# Patient Record
Sex: Female | Born: 2000 | Race: Black or African American | Hispanic: No | Marital: Single
Health system: Southern US, Community
[De-identification: ages and names within clinical notes are randomized; demographics above are authoritative.]

## PROBLEM LIST (undated history)

## (undated) DIAGNOSIS — J45909 Unspecified asthma, uncomplicated: Secondary | ICD-10-CM

---
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
## 2020-02-07 ENCOUNTER — Ambulatory Visit: Payer: Self-pay | Attending: Family

## 2020-02-07 DIAGNOSIS — Z23 Encounter for immunization: Secondary | ICD-10-CM

## 2020-02-07 NOTE — Progress Notes (Signed)
   Covid-19 Vaccination Clinic  Name:  Alexis Gilmore    MRN: [id number] DOB: 2001-06-22  02/07/2020  Ms. Wonder was observed post Covid-19 immunization for 15 minutes without incident. She was provided with Vaccine Information Sheet and instruction to access the V-Safe system.   [name] was instructed to call 911 with any severe reactions post vaccine: Marland Kitchen Difficulty breathing  . Swelling of face and throat  . A fast heartbeat  . A bad rash all over body  . Dizziness and weakness   Immunizations Administered    Name Date Dose VIS Date Route   Moderna COVID-19 Vaccine 02/07/2020  5:16 PM 0.5 mL 10/30/2019 Intramuscular   Manufacturer: Moderna   Lot: [id number]   NDC: 53664-403-47

## 2020-03-11 ENCOUNTER — Other Ambulatory Visit: Payer: Self-pay

## 2020-03-11 ENCOUNTER — Ambulatory Visit: Payer: Self-pay | Attending: Family

## 2020-03-11 DIAGNOSIS — Z23 Encounter for immunization: Secondary | ICD-10-CM

## 2020-03-11 NOTE — Progress Notes (Signed)
   Covid-19 Vaccination Clinic  Name:  Shakhia Gramajo    MRN: [id number] DOB: 2001/03/20  03/11/2020  Ms. Latulippe was observed post Covid-19 immunization for 15 minutes without incident. She was provided with Vaccine Information Sheet and instruction to access the V-Safe system.   Ms. Joswick was instructed to call 911 with any severe reactions post vaccine: Marland Kitchen Difficulty breathing  . Swelling of face and throat  . A fast heartbeat  . A bad rash all over body  . Dizziness and weakness   Immunizations Administered    Name Date Dose VIS Date Route   Moderna COVID-19 Vaccine 03/11/2020  2:25 PM 0.5 mL 10/30/2019 Intramuscular   Manufacturer: Moderna   Lot: [id number]   NDC: 19379-024-09

## 2020-11-11 ENCOUNTER — Ambulatory Visit: Payer: Self-pay

## 2020-11-18 ENCOUNTER — Other Ambulatory Visit (HOSPITAL_BASED_OUTPATIENT_CLINIC_OR_DEPARTMENT_OTHER): Payer: Self-pay | Admitting: Internal Medicine

## 2020-11-18 ENCOUNTER — Ambulatory Visit: Payer: Self-pay

## 2020-11-19 MED FILL — MODERNA COVID-19 VACCINE 10: 100 | 1 days supply | Qty: 0 | Fill #0

## 2020-12-22 ENCOUNTER — Other Ambulatory Visit: Payer: Self-pay

## 2021-04-07 ENCOUNTER — Ambulatory Visit
Admission: RE | Admit: 2021-04-07 | Discharge: 2021-04-07 | Disposition: A | Discharge status: Home / Self Care | Payer: Federal, State, Local not specified - PPO | Source: Ambulatory Visit | Attending: Family Medicine | Admitting: Family Medicine

## 2021-04-07 ENCOUNTER — Other Ambulatory Visit: Payer: Self-pay | Admitting: Family Medicine

## 2021-04-07 DIAGNOSIS — M79652 Pain in left thigh: Secondary | ICD-10-CM

## 2021-04-07 DIAGNOSIS — M79605 Pain in left leg: Secondary | ICD-10-CM

## 2021-08-05 ENCOUNTER — Other Ambulatory Visit: Payer: Self-pay

## 2021-08-05 ENCOUNTER — Encounter (HOSPITAL_COMMUNITY): Payer: Self-pay

## 2021-08-05 ENCOUNTER — Emergency Department (HOSPITAL_COMMUNITY)
Admission: EM | Admit: 2021-08-05 | Discharge: 2021-08-06 | Disposition: A | Discharge status: Home / Self Care | Payer: Federal, State, Local not specified - PPO | Attending: Emergency Medicine | Admitting: Emergency Medicine

## 2021-08-05 ENCOUNTER — Emergency Department (HOSPITAL_COMMUNITY): Payer: Federal, State, Local not specified - PPO

## 2021-08-05 DIAGNOSIS — Z5321 Procedure and treatment not carried out due to patient leaving prior to being seen by health care provider: Secondary | ICD-10-CM | POA: Diagnosis not present

## 2021-08-05 DIAGNOSIS — R791 Abnormal coagulation profile: Secondary | ICD-10-CM | POA: Diagnosis not present

## 2021-08-05 DIAGNOSIS — R202 Paresthesia of skin: Secondary | ICD-10-CM | POA: Insufficient documentation

## 2021-08-05 LAB — PROTIME-INR
INR: 1 (ref 0.8–1.2)
Prothrombin Time: 13.2 seconds (ref 11.4–15.2)

## 2021-08-05 LAB — COMPREHENSIVE METABOLIC PANEL
ALT: 11 U/L (ref 0–44)
AST: 18 U/L (ref 15–41)
Albumin: 4.3 g/dL (ref 3.5–5.0)
Alkaline Phosphatase: 50 U/L (ref 38–126)
Anion gap: 7 (ref 5–15)
BUN: 13 mg/dL (ref 6–20)
CO2: 26 mmol/L (ref 22–32)
Calcium: 9.1 mg/dL (ref 8.9–10.3)
Chloride: 104 mmol/L (ref 98–111)
Creatinine, Ser: 0.86 mg/dL (ref 0.44–1.00)
GFR, Estimated: >60 mL/min (ref >60)
Glucose, Bld: 85 mg/dL (ref 70–99)
Potassium: 3.8 mmol/L (ref 3.5–5.1)
Sodium: 137 mmol/L (ref 135–145)
Total Bilirubin: 0.7 mg/dL (ref 0.3–1.2)
Total Protein: 7 g/dL (ref 6.5–8.1)

## 2021-08-05 LAB — CBC
HCT: 36.8 % (ref 36.0–46.0)
Hemoglobin: 12.3 g/dL (ref 12.0–15.0)
MCH: 30.1 pg (ref 26.0–34.0)
MCHC: 33.4 g/dL (ref 30.0–36.0)
MCV: 90.2 fL (ref 80.0–100.0)
Platelets: 210 10*3/uL (ref 150–400)
RBC: 4.08 MIL/uL (ref 3.87–5.11)
RDW: 12.8 % (ref 11.5–15.5)
WBC: 5.2 10*3/uL (ref 4.0–10.5)
nRBC: 0 % (ref 0.0–0.2)

## 2021-08-05 LAB — DIFFERENTIAL
Abs Immature Granulocytes: 0.01 10*3/uL (ref 0.00–0.07)
Basophils Absolute: 0 10*3/uL (ref 0.0–0.1)
Basophils Relative: 1 %
Eosinophils Absolute: 0.2 10*3/uL (ref 0.0–0.5)
Eosinophils Relative: 3 %
Immature Granulocytes: 0 %
Lymphocytes Relative: 38 %
Lymphs Abs: 2 10*3/uL (ref 0.7–4.0)
Monocytes Absolute: 0.3 10*3/uL (ref 0.1–1.0)
Monocytes Relative: 6 %
Neutro Abs: 2.7 10*3/uL (ref 1.7–7.7)
Neutrophils Relative %: 52 %

## 2021-08-05 LAB — I-STAT BETA HCG BLOOD, ED (MC, WL, AP ONLY): I-stat hCG, quantitative: <5 m[IU]/mL (ref <5)

## 2021-08-05 LAB — APTT: aPTT: 31 seconds (ref 24–36)

## 2021-08-05 LAB — MAGNESIUM: Magnesium: 2.4 mg/dL (ref 1.7–2.4)
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
## 2021-08-05 NOTE — ED Provider Notes (Signed)
Emergency Medicine Provider Triage Evaluation Note  Alexis Gilmore , a 20 y.o. female  was evaluated in triage.  Pt complains of numbness to left side of her body.  Since this started approximately 1700 today.  Has been constant since then.  Patient denies any recent falls or head injuries.  Patient denies any illicit drug use.  Denies any visual disturbance, weakness, or headache.  Review of Systems  Positive: Numbness Negative: visual disturbance, weakness, or headache.  Physical Exam  BP 133/87 (BP Location: Right Arm)   Pulse 62   Temp 98.2 F (36.8 C) (Oral)   Resp 18   SpO2 96%  Gen:   Awake, no distress   Resp:  Normal effort  MSK:   Moves extremities without difficulty  Other:  CN II through XII intact.  +5 strength to bilateral upper lower extremities.  Grip strength equal.  Pronator drift negative.  Patient reports decree sensation to light touch throughout entire left side of her body.  Medical Decision Making  Medically screening exam initiated at 6:15 PM.  Appropriate orders placed.  Alexis Gilmore was informed that the remainder of the evaluation will be completed by another provider, this initial triage assessment does not replace that evaluation, and the importance of remaining in the ED until their evaluation is complete.  Discussed with attending physician Dr. Hart Rochester.  The patient appears stable so that the remainder of the work up may be completed by another provider.      Alexis Schroeder, PA-C 08/05/21 1817    Alexis Avena, MD 08/05/21 2145

## 2021-08-06 NOTE — ED Notes (Signed)
Pt called 3x. Eloped from waiting area.

## 2021-08-06 NOTE — ED Notes (Signed)
Called for pt in ED lobby for vitals assessment; no answer x1. ENMiles 

## 2022-10-27 ENCOUNTER — Other Ambulatory Visit: Payer: Self-pay

## 2022-10-27 ENCOUNTER — Emergency Department (HOSPITAL_COMMUNITY)
Admission: EM | Admit: 2022-10-27 | Discharge: 2022-10-27 | Disposition: A | Discharge status: Home / Self Care | Payer: Federal, State, Local not specified - PPO | Attending: Emergency Medicine | Admitting: Emergency Medicine

## 2022-10-27 ENCOUNTER — Emergency Department (HOSPITAL_COMMUNITY): Payer: Federal, State, Local not specified - PPO

## 2022-10-27 DIAGNOSIS — R0789 Other chest pain: Secondary | ICD-10-CM | POA: Diagnosis not present

## 2022-10-27 DIAGNOSIS — R079 Chest pain, unspecified: Secondary | ICD-10-CM | POA: Diagnosis present

## 2022-10-27 DIAGNOSIS — J45909 Unspecified asthma, uncomplicated: Secondary | ICD-10-CM | POA: Diagnosis not present

## 2022-10-27 LAB — COMPREHENSIVE METABOLIC PANEL
ALT: 19 U/L (ref 0–44)
AST: 21 U/L (ref 15–41)
Albumin: 4.3 g/dL (ref 3.5–5.0)
Alkaline Phosphatase: 54 U/L (ref 38–126)
Anion gap: 7 (ref 5–15)
BUN: 13 mg/dL (ref 6–20)
CO2: 27 mmol/L (ref 22–32)
Calcium: 9.1 mg/dL (ref 8.9–10.3)
Chloride: 104 mmol/L (ref 98–111)
Creatinine, Ser: 0.92 mg/dL (ref 0.44–1.00)
GFR, Estimated: >60 mL/min (ref >60)
Glucose, Bld: 99 mg/dL (ref 70–99)
Potassium: 3.7 mmol/L (ref 3.5–5.1)
Sodium: 138 mmol/L (ref 135–145)
Total Bilirubin: 0.9 mg/dL (ref 0.3–1.2)
Total Protein: 7.6 g/dL (ref 6.5–8.1)

## 2022-10-27 LAB — CBC WITH DIFFERENTIAL/PLATELET
Abs Immature Granulocytes: 0.01 10*3/uL (ref 0.00–0.07)
Basophils Absolute: 0.1 10*3/uL (ref 0.0–0.1)
Basophils Relative: 1 %
Eosinophils Absolute: 0.3 10*3/uL (ref 0.0–0.5)
Eosinophils Relative: 4 %
HCT: 41 % (ref 36.0–46.0)
Hemoglobin: 13.6 g/dL (ref 12.0–15.0)
Immature Granulocytes: 0 %
Lymphocytes Relative: 34 %
Lymphs Abs: 2.5 10*3/uL (ref 0.7–4.0)
MCH: 29.8 pg (ref 26.0–34.0)
MCHC: 33.2 g/dL (ref 30.0–36.0)
MCV: 89.9 fL (ref 80.0–100.0)
Monocytes Absolute: 0.5 10*3/uL (ref 0.1–1.0)
Monocytes Relative: 7 %
Neutro Abs: 3.9 10*3/uL (ref 1.7–7.7)
Neutrophils Relative %: 54 %
Platelets: 252 10*3/uL (ref 150–400)
RBC: 4.56 MIL/uL (ref 3.87–5.11)
RDW: 12.4 % (ref 11.5–15.5)
WBC: 7.3 10*3/uL (ref 4.0–10.5)
nRBC: 0 % (ref 0.0–0.2)

## 2022-10-27 LAB — TROPONIN I (HIGH SENSITIVITY)
Troponin I (High Sensitivity): <2 ng/L (ref <18)
Troponin I (High Sensitivity): <2 ng/L (ref <18)

## 2022-10-27 LAB — D-DIMER, QUANTITATIVE: D-Dimer, Quant: <0.27 ug/mL-FEU (ref 0.00–0.50)

## 2022-10-27 MED ORDER — PREDNISONE 10 MG PO TABS: 20.0000 mg | ORAL_TABLET | Freq: Every day | ORAL | 0 refills | Status: AC

## 2022-10-27 MED ORDER — ALBUTEROL SULFATE HFA 108 (90 BASE) MCG/ACT IN AERS
2.0000 | INHALATION_SPRAY | Freq: Once | RESPIRATORY_TRACT | Status: AC
  Administered 2022-10-27: 2 via RESPIRATORY_TRACT
  Filled 2022-10-27: qty 6.7

## 2022-10-27 NOTE — ED Provider Triage Note (Signed)
Emergency Medicine Provider Triage Evaluation Note  Alexis Gilmore , a 21 y.o. female  was evaluated in triage.  Pt complains of chest pain for the past 3 to 4 hours after awakening this morning.  She reports chest pain is worsened with lying flat as well as taking a deep breath.  Located anterior substernal.  She notes some right sided upper thoracic back pain as well.  Reports some vague shortness of breath.  Denies any recent illness, fever, chills, abdominal pain, nausea, vomiting.  Denies history of heart or lung issues, connective tissue disorder.  She has taken no medication for this.  Review of Systems  Positive: See above Negative:   Physical Exam  BP (!) 151/114 (BP Location: Left Arm)   Pulse 62   Temp 98.3 F (36.8 C) (Oral)   Resp 15   Ht 5\' 1"  (1.549 m)   Wt 72.6 kg   LMP 09/12/2022   SpO2 100%   BMI 30.23 kg/m  Gen:   Awake, no distress  Resp:  Normal effort  MSK:   Moves extremities without difficulty  Other:  No obvious murmurs gallops or rubs.  Lungs clear to auscultation bilaterally.  Radial and pedal pulses full intact bilaterally.  No lower extremity edema noted.  Medical Decision Making  Medically screening exam initiated at 3:44 PM.  Appropriate orders placed.  Alexis Gilmore was informed that the remainder of the evaluation will be completed by another provider, this initial triage assessment does not replace that evaluation, and the importance of remaining in the ED until their evaluation is complete.     Marveen Reeks, Peter Garter 10/27/22 1546

## 2022-10-27 NOTE — Discharge Instructions (Signed)
Return for any problem.  ?

## 2022-10-27 NOTE — ED Triage Notes (Signed)
Pt reports chest pain x 3 hours. Also c/o pain between shoulder blades and sob with "taking a deep breath"

## 2022-10-27 NOTE — ED Notes (Signed)
Patient has no concerns after AVS has been reviewed and patient education provided. Patient discharged.Patient has no concerns after AVS has been reviewed and patient education provided. Patient discharged. 

## 2022-10-27 NOTE — ED Provider Notes (Signed)
[facility]-EMERGENCY DEPT Provider Note   CSN: [id number] Arrival date & time: 10/27/22  1519     History  Chief Complaint  Patient presents with   Chest Pain    Alexis Gilmore is a 21 y.o. female.  21 year old female with prior medical history as detailed below presents for evaluation.  Patient with history of very mild asthma that does not require significant daily intervention or treatment.  Patient reports chest tightness that began early this morning.  Patient reports that symptoms are worse with a deep breath.  She denies any significant pain at time of exam.  She denies any cough, recent illness, fever, chills, belly pain, nausea, vomiting.  She denies any history of cardiac disease in the past.  She has not taken any medications for same.  The history is provided by the patient and medical records.       Home Medications Prior to Admission medications   Medication Sig Start Date End Date Taking? Authorizing Provider  ciprofloxacin (CILOXAN) 0.3 % ophthalmic solution Place 1 drop into the left eye every 4 (four) hours while awake. Administer 1 drop, every 2 hours, while awake, for 2 days. Then 1 drop, every 4 hours, while awake, for the next 5 days. 12/20/16   Liberty Handy, PA-C      Allergies    Patient has no known allergies.    Review of Systems   Review of Systems  All other systems reviewed and are negative.   Physical Exam Updated Vital Signs BP 116/78   Pulse 85   Temp 98.3 F (36.8 C) (Oral)   Resp 17   Ht 5\' 1"  (1.549 m)   Wt 72.6 kg   LMP 10/09/2022   SpO2 100%   BMI 30.23 kg/m  Physical Exam Vitals and nursing note reviewed.  Constitutional:      General: She is not in acute distress.    Appearance: Normal appearance. She is well-developed.  HENT:     Head: Normocephalic and atraumatic.  Eyes:     Conjunctiva/sclera: Conjunctivae normal.     Pupils: Pupils are equal, round, and reactive to light.   Cardiovascular:     Rate and Rhythm: Normal rate and regular rhythm.     Heart sounds: Normal heart sounds.  Pulmonary:     Effort: Pulmonary effort is normal. No respiratory distress.     Breath sounds: Normal breath sounds.  Abdominal:     General: There is no distension.     Palpations: Abdomen is soft.     Tenderness: There is no abdominal tenderness.  Musculoskeletal:        General: No deformity. Normal range of motion.     Cervical back: Normal range of motion and neck supple.  Skin:    General: Skin is warm and dry.  Neurological:     General: No focal deficit present.     Mental Status: She is alert and oriented to person, place, and time.     ED Results / Procedures / Treatments   Labs (all labs ordered are listed, but only abnormal results are displayed) Labs Reviewed  COMPREHENSIVE METABOLIC PANEL  CBC WITH DIFFERENTIAL/PLATELET  D-DIMER, QUANTITATIVE  TROPONIN I (HIGH SENSITIVITY)  TROPONIN I (HIGH SENSITIVITY)    EKG EKG Interpretation  Date/Time:  Wednesday October 27 2022 15:29:27 EST Ventricular Rate:  72 PR Interval:  147 QRS Duration: 82 QT Interval:  397 QTC Calculation: 435 R Axis:   37 Text Interpretation: Sinus  rhythm Baseline wander in lead(s) II III aVF Confirmed by Kristine Royal [phone]) on 10/27/2022 5:26:49 PM  Radiology DG Chest 1 View  Result Date: 10/27/2022 CLINICAL DATA:  Chest Pain EXAM: CHEST  1 VIEW COMPARISON:  09/05/2010 FINDINGS: No pleural effusion. No pneumothorax. No focal airspace opacity. No displaced rib fracture. Normal cardiac and mediastinal contours. Visualized upper abdomen is unremarkable. IMPRESSION: No focal airspace opacity. Electronically Signed   By: Lorenza Cambridge M.D.   On: 10/27/2022 16:50    Procedures Procedures    Medications Ordered in ED Medications  albuterol (VENTOLIN HFA) 108 (90 Base) MCG/ACT inhaler 2 puff (2 puffs Inhalation Given 10/27/22 1744)    ED Course/ Medical Decision Making/  A&P                           Medical Decision Making Amount and/or Complexity of Data Reviewed Labs: ordered.  Risk Prescription drug management.    Medical Screen Complete  This patient presented to the ED with complaint of atypical chest discomfort.  This complaint involves an extensive number of treatment options. The initial differential diagnosis includes, but is not limited to, ACS, angina, asthma exacerbation, bronchospasm, metabolic abnormality, etc.  This presentation is: Acute, Self-Limited, Previously Undiagnosed, Uncertain Prognosis, Complicated, Systemic Symptoms, and Threat to Life/Bodily Function  Patient is presenting with complaint of atypical chest discomfort.  Patient symptoms not consistent with likely ACS or cardiac pathology.  EKG is without evidence of acute ischemia.  Troponin x2 is undetectable.  Additionally D-dimer is negative which significantly reduces the chances of likely thromboembolic event.  Chest x-ray is without significant abnormality.  Patient given albuterol with improvement in symptoms.  Given patient's history of very mild asthma I suspect symptoms are secondary to mild bronchospasm.  Patient is prescribed prednisone for use if her symptoms worsen.  She is advised to use your butyryl as a rescue inhaler if necessary.  Patient is advised that if she has persistent worsening symptoms of asthma she will require long-term medications to help control it versus just using albuterol as a rescue inhaler.  Importance of close follow-up was stressed.  Strict return precautions given and understood.  Additional history obtained:  External records from outside sources obtained and reviewed including prior ED visits and prior Inpatient records.    Lab Tests:  I ordered and personally interpreted labs.  The pertinent results include: CBC, CMP, troponin x2, D-dimer   Imaging Studies ordered:  I ordered imaging studies including chest x-ray I  independently visualized and interpreted obtained imaging which showed NAD I agree with the radiologist interpretation.   Cardiac Monitoring:  The patient was maintained on a cardiac monitor.  I personally viewed and interpreted the cardiac monitor which showed an underlying rhythm of: NSR   Medicines ordered:  I ordered medication including albuterol for suspected mild bronchospasm Reevaluation of the patient after these medicines showed that the patient: improved    Problem List / ED Course:  Atypical chest pain, suspected mild bronchospasm   Reevaluation:  After the interventions noted above, I reevaluated the patient and found that they have: improved   Disposition:  After consideration of the diagnostic results and the patients response to treatment, I feel that the patent would benefit from close outpatient follow-up.          Final Clinical Impression(s) / ED Diagnoses Final diagnoses:  Atypical chest pain    Rx / DC Orders ED Discharge Orders  Ordered    predniSONE (DELTASONE) 10 MG tablet  Daily        10/27/22 1930              Wynetta Fines, MD 10/27/22 1935

## 2023-07-02 IMAGING — CT CT HEAD W/O CM
3 series · 15 of 47 positions shown, 18 images · non-contrast
Comparison: None.

CLINICAL DATA: Numbness to left side of her body

EXAM:
CT HEAD WITHOUT CONTRAST
TECHNIQUE: Contiguous axial images were obtained from the base of the skull
through the vertex without intravenous contrast.

[Series 3: head wo · axial · 0.44mm/px · z∈[+1522,+1652]mm · 9 of 32 slices shown, 12 images]
[im 3/32  brain]
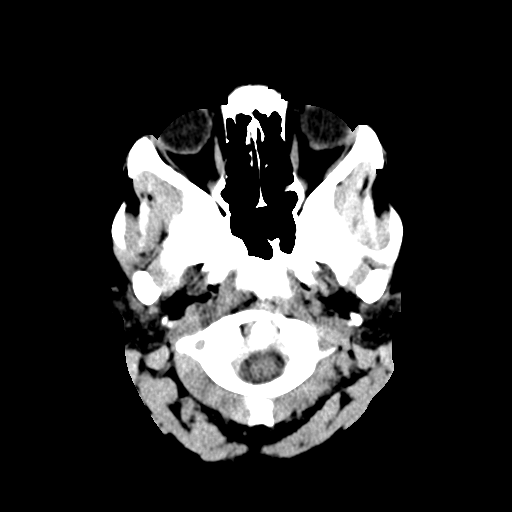
[im 3/32  bone]
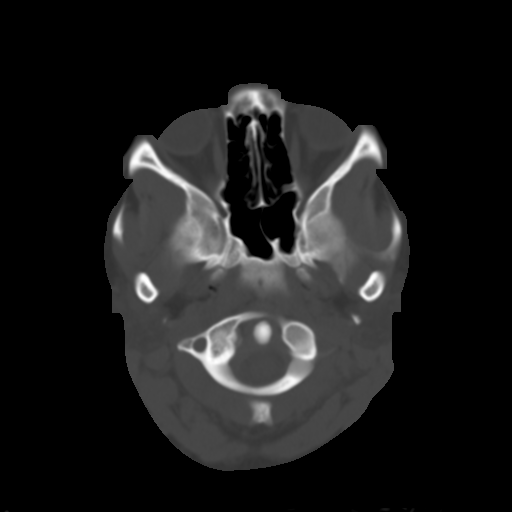
[im 6/32  brain]
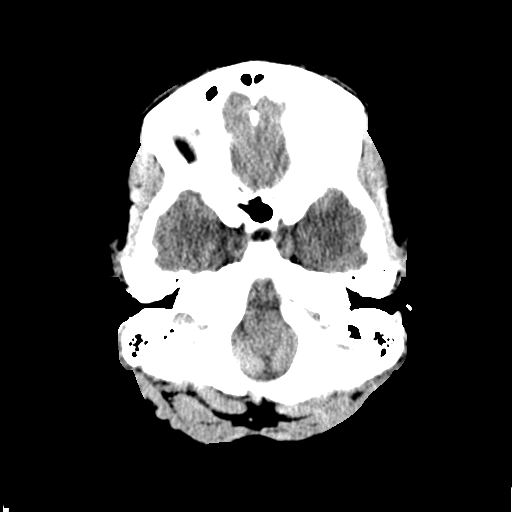
[im 9/32  brain]
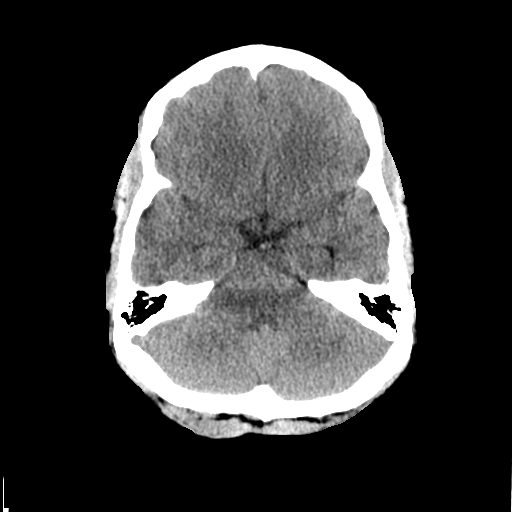
[im 12/32  brain]
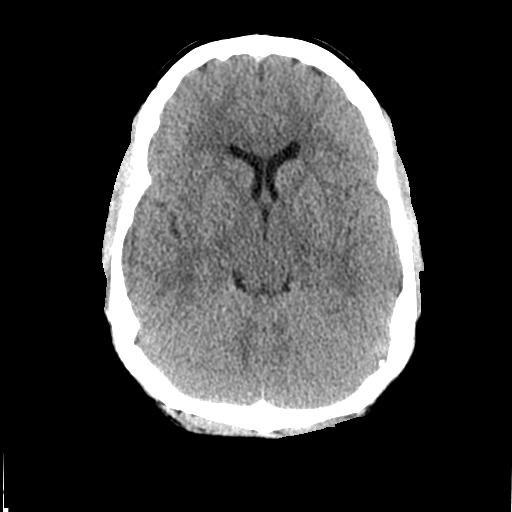
[im 17/32  brain]
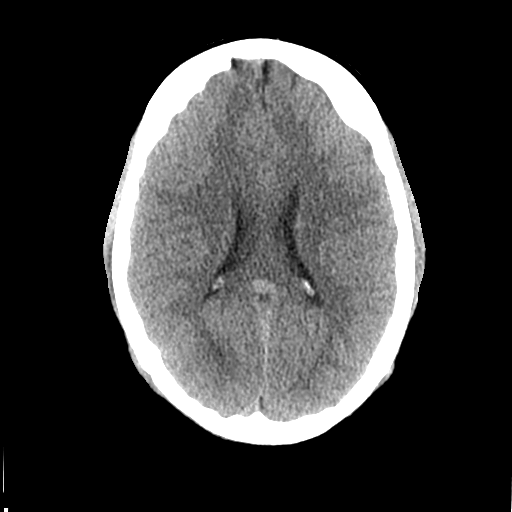
[im 17/32  bone]
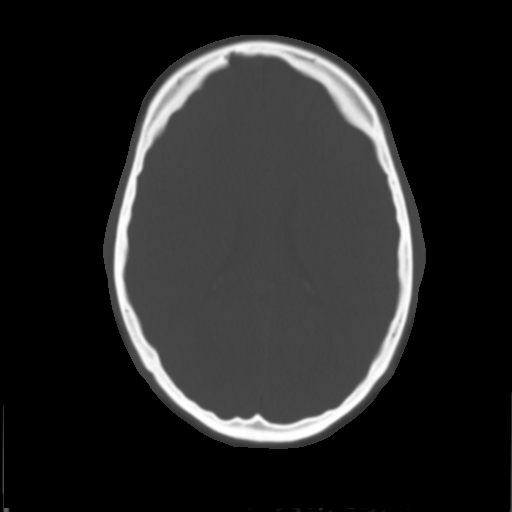
[im 20/32  brain]
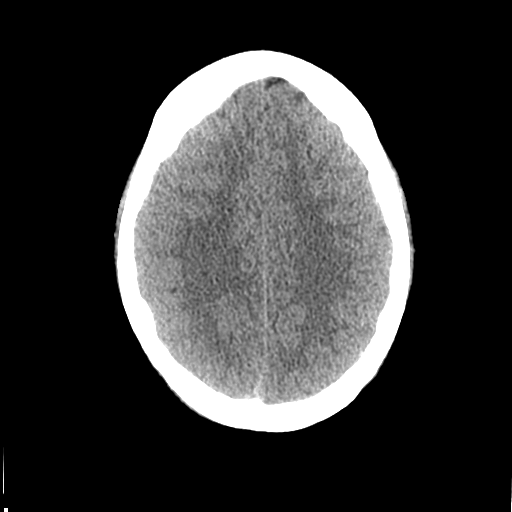
[im 23/32  brain]
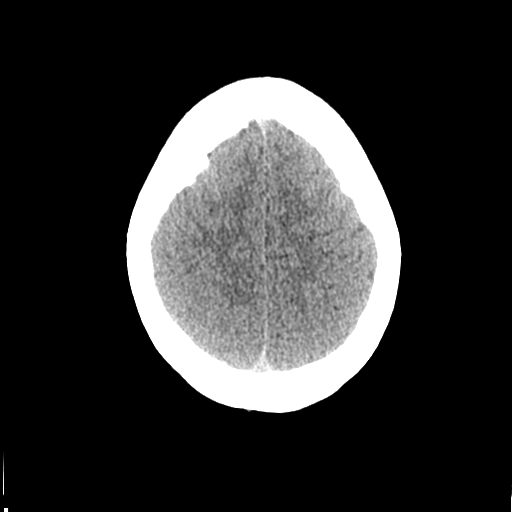
[im 26/32  brain]
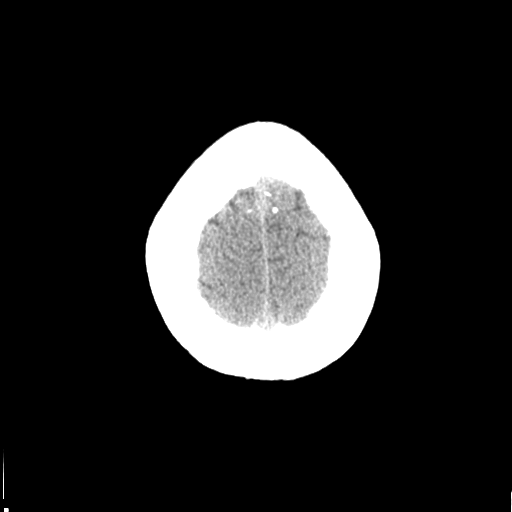
[im 29/32  brain]
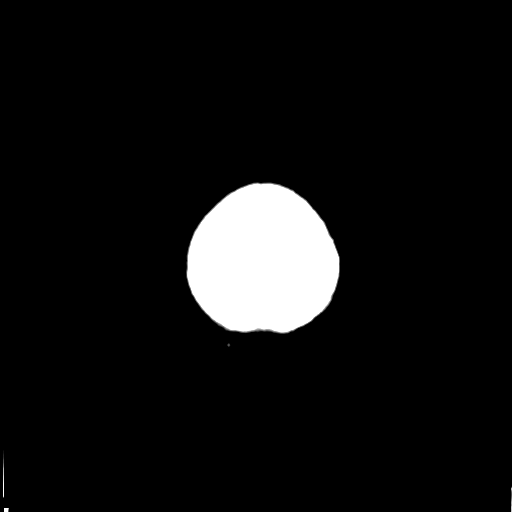
[im 29/32  bone]
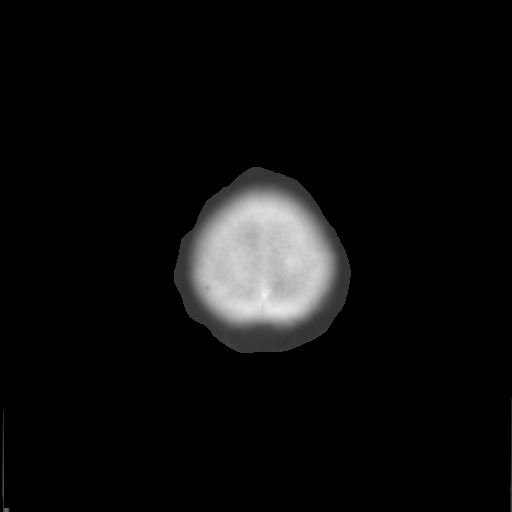

[Series 4: coronal soft tissue · coronal · 0.31mm/px · 3 of 75 slices shown]
[im 25/75  brain]
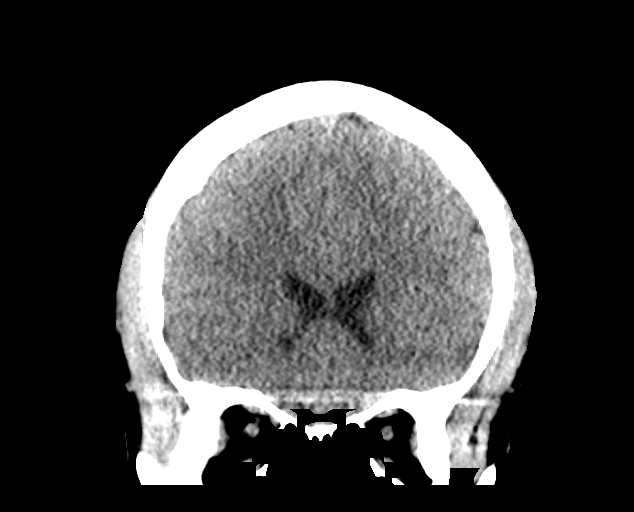
[im 33/75  brain]
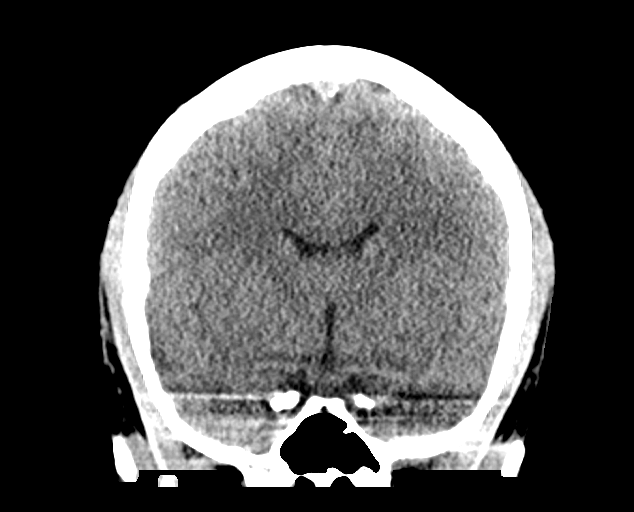
[im 42/75  brain]
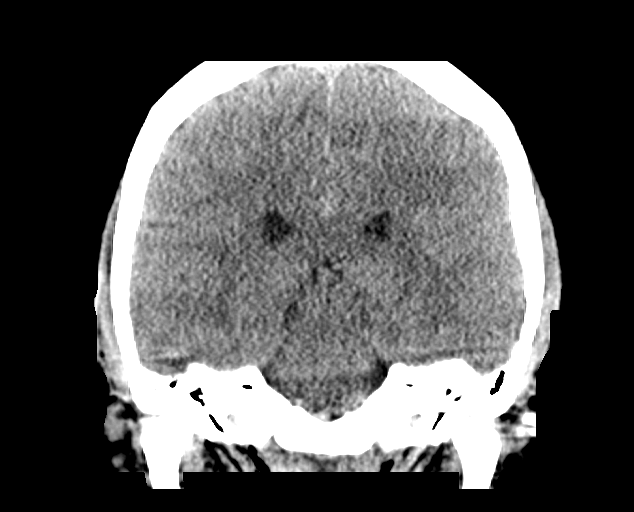

[Series 5: sagittal soft tissue · sagittal · 0.32mm/px · 3 of 61 slices shown]
[im 21/61  brain]
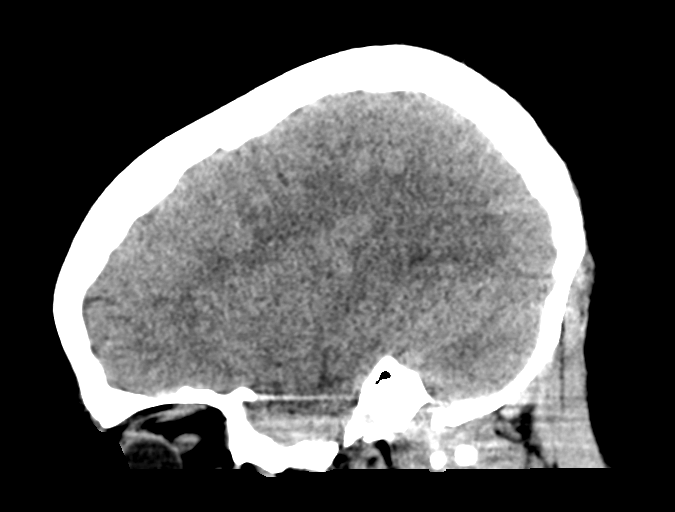
[im 31/61  brain]
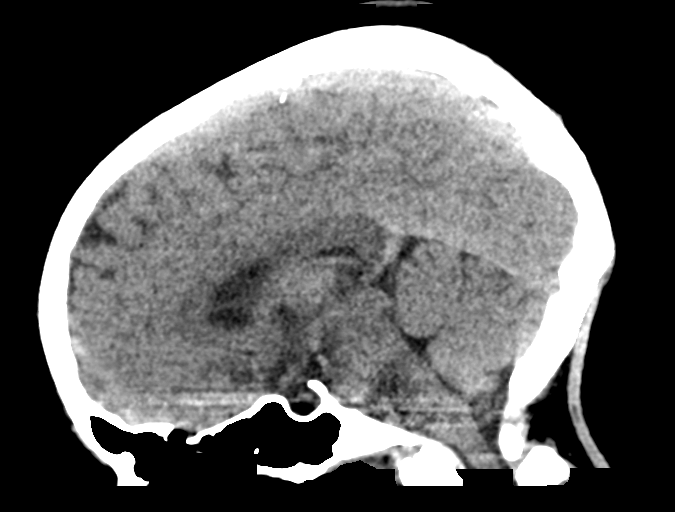
[im 41/61  brain]
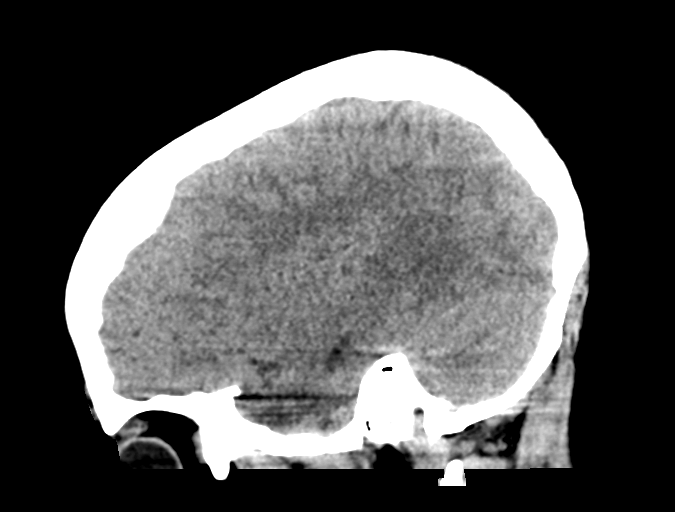

[15 of 47 positions shown; findings below may reference images not displayed]

FINDINGS: Brain: No evidence of acute infarction, hemorrhage, hydrocephalus,
extra-axial collection or mass lesion/mass effect. Partially empty
sella turcica.

Vascular: No hyperdense vessel or unexpected calcification.

Skull: Normal. Negative for fracture or focal lesion.

Sinuses/Orbits: No acute finding.

Other: None.
IMPRESSION: No acute intracranial pathology.
